# Patient Record
Sex: Male | Born: 1975 | Race: White | Hispanic: No | Marital: Single | Smoking: Never smoker
Health system: Southern US, Community
[De-identification: ages and names within clinical notes are randomized; demographics above are authoritative.]

## PROBLEM LIST (undated history)

## (undated) DIAGNOSIS — E119 Type 2 diabetes mellitus without complications: Secondary | ICD-10-CM

## (undated) HISTORY — PX: APPENDECTOMY: SHX54

## (undated) HISTORY — PX: CHOLECYSTECTOMY: SHX55

---

## 2020-01-08 ENCOUNTER — Emergency Department
Admission: EM | Admit: 2020-01-08 | Discharge: 2020-01-08 | Disposition: A | Discharge status: Home / Self Care | Payer: Self-pay | Attending: Emergency Medicine | Admitting: Emergency Medicine

## 2020-01-08 ENCOUNTER — Emergency Department: Payer: Self-pay

## 2020-01-08 ENCOUNTER — Other Ambulatory Visit: Payer: Self-pay

## 2020-01-08 ENCOUNTER — Encounter: Payer: Self-pay | Admitting: Emergency Medicine

## 2020-01-08 DIAGNOSIS — S22080A Wedge compression fracture of T11-T12 vertebra, initial encounter for closed fracture: Secondary | ICD-10-CM | POA: Insufficient documentation

## 2020-01-08 DIAGNOSIS — Y999 Unspecified external cause status: Secondary | ICD-10-CM | POA: Insufficient documentation

## 2020-01-08 DIAGNOSIS — S32010A Wedge compression fracture of first lumbar vertebra, initial encounter for closed fracture: Secondary | ICD-10-CM | POA: Insufficient documentation

## 2020-01-08 DIAGNOSIS — Y9389 Activity, other specified: Secondary | ICD-10-CM | POA: Insufficient documentation

## 2020-01-08 DIAGNOSIS — W08XXXA Fall from other furniture, initial encounter: Secondary | ICD-10-CM | POA: Insufficient documentation

## 2020-01-08 DIAGNOSIS — Y9289 Other specified places as the place of occurrence of the external cause: Secondary | ICD-10-CM | POA: Insufficient documentation

## 2020-01-08 IMAGING — MR MR LUMBAR SPINE W/O CM
5 series · 33 of 48 positions shown · non-contrast
Comparison: None.

CLINICAL DATA: Mid to low back pain.

EXAM:
MRI THORACIC AND LUMBAR SPINE WITHOUT CONTRAST
TECHNIQUE: Multiplanar and multiecho pulse sequences of the thoracic and lumbar
spine were obtained without intravenous contrast.

[Series 19: T2 · sagittal · 4.0mm · 1.09mm/px · 6 of 17 slices shown (1 of 2)]
[im 1/17]
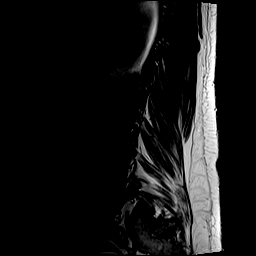
[im 4/17]
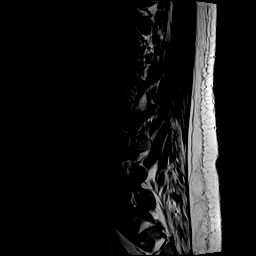
[im 7/17]
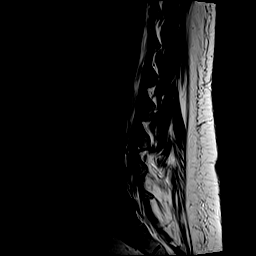
[im 10/17]
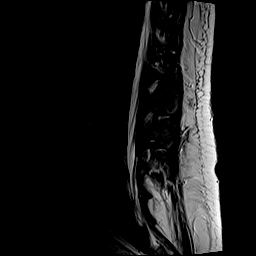
[im 13/17]
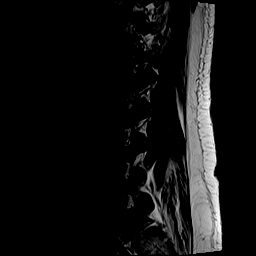
[im 17/17]
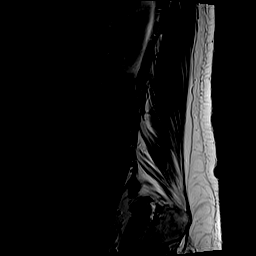

[Series 20: T1 · sagittal · 4.0mm · 1.09mm/px · 7 of 17 slices shown (1 of 2)]
[im 1/17]
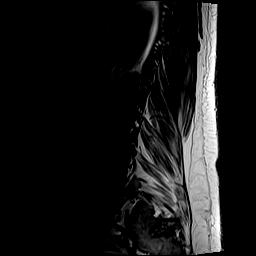
[im 3/17]
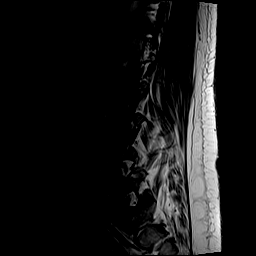
[im 6/17]
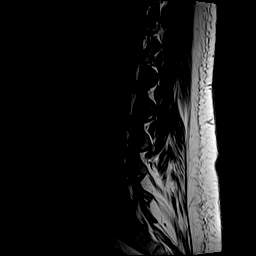
[im 9/17]
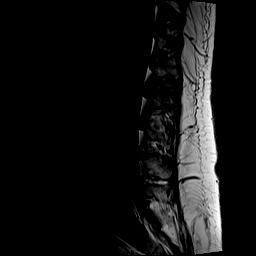
[im 11/17]
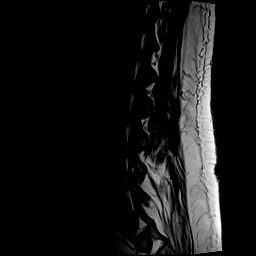
[im 14/17]
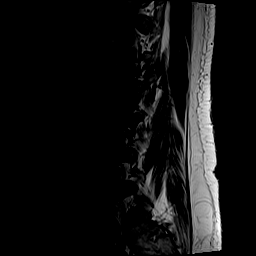
[im 17/17]
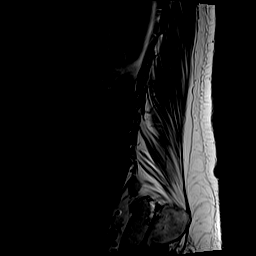

[Series 21: STIR · sagittal · 4.0mm · 0.55mm/px · 4 of 17 slices shown]
[im 1/17]
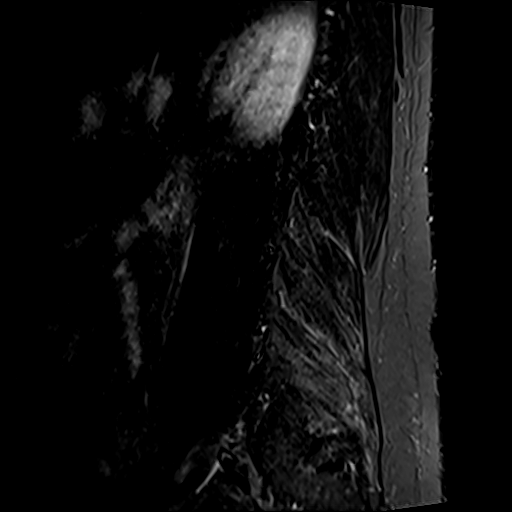
[im 3/17]
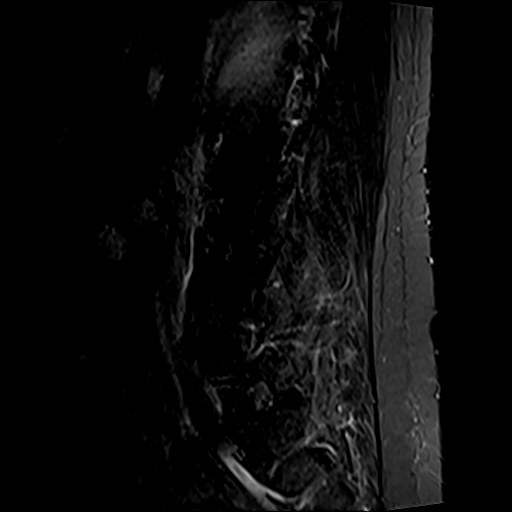
[im 6/17]
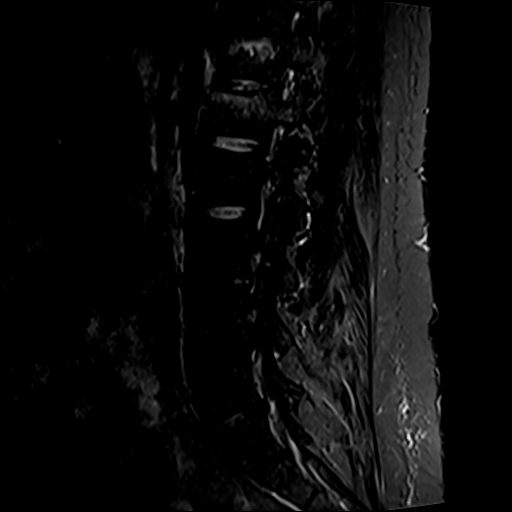
[im 9/17]
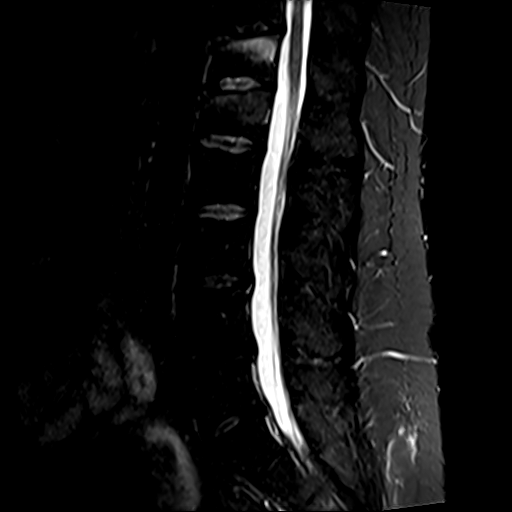

[Series 22: T2 · axial · 4.0mm · 0.78mm/px · z∈[-618,-393]mm · 8 of 36 slices shown (2 of 2)]
[im 1/36]
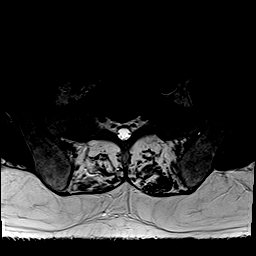
[im 6/36]
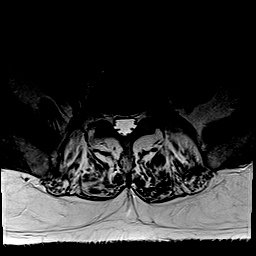
[im 11/36]
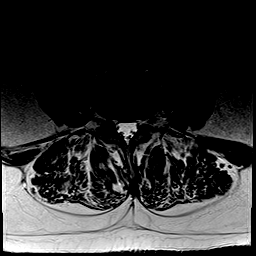
[im 17/36]
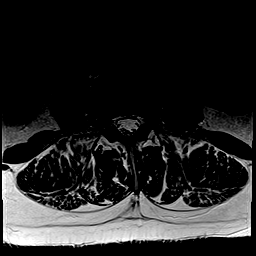
[im 19/36]
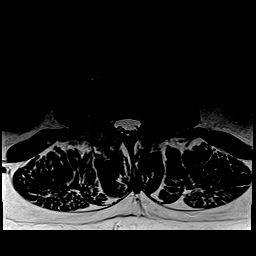
[im 25/36]
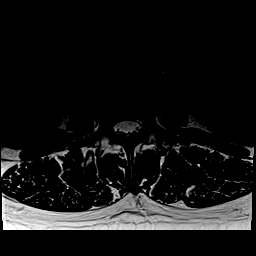
[im 30/36]
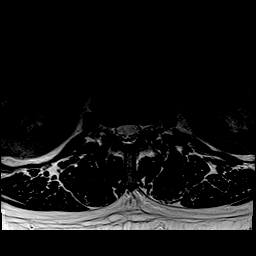
[im 36/36]
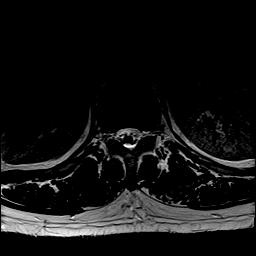

[Series 23: T1 · axial · 4.0mm · 0.39mm/px · z∈[-618,-393]mm · 8 of 36 slices shown (2 of 2)]
[im 1/36]
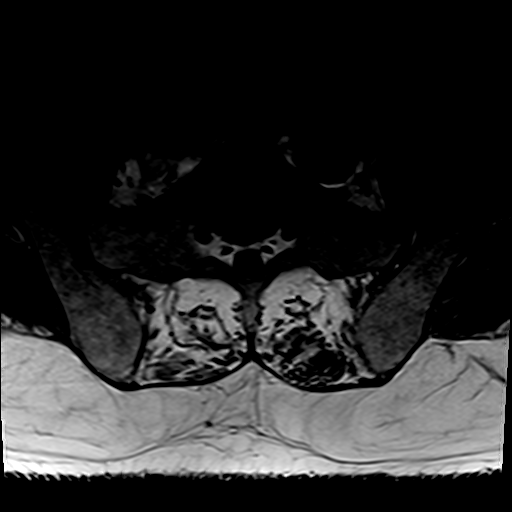
[im 6/36]
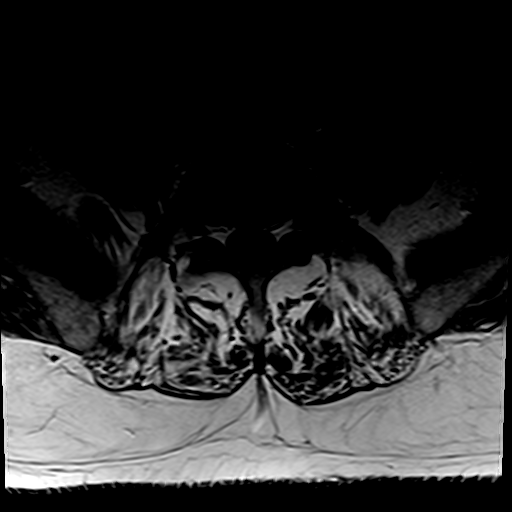
[im 11/36]
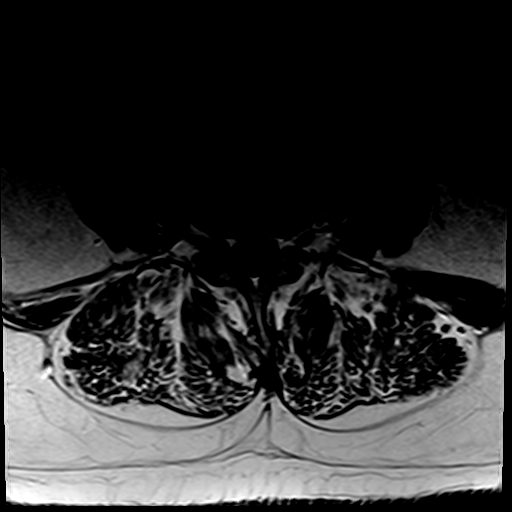
[im 17/36]
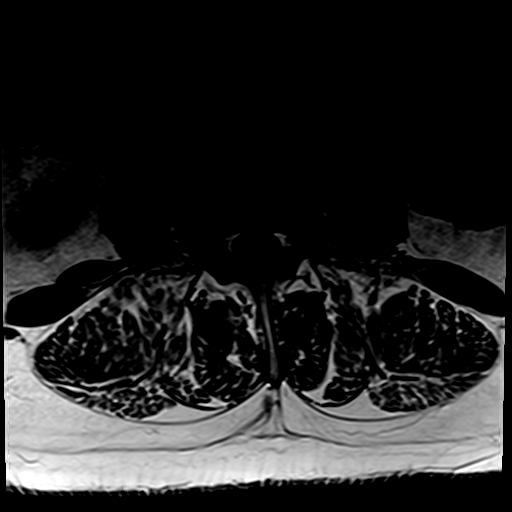
[im 19/36]
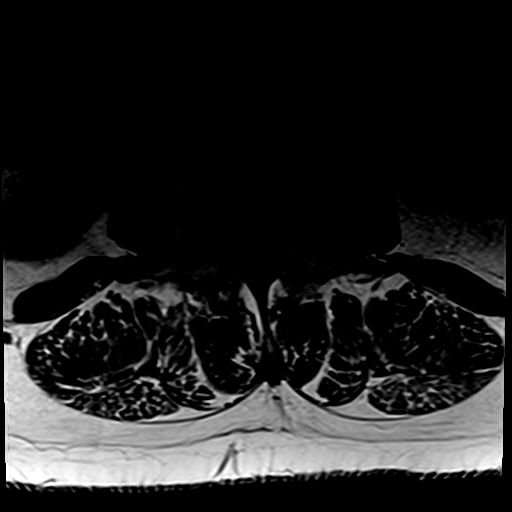
[im 25/36]
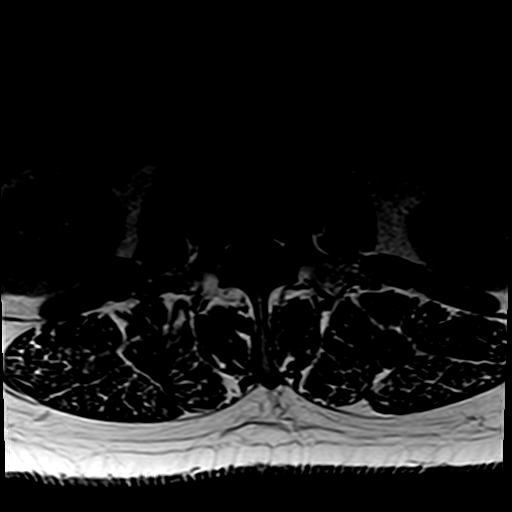
[im 30/36]
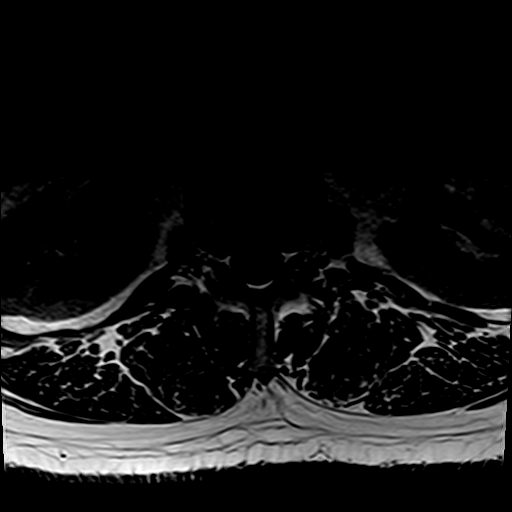
[im 36/36]
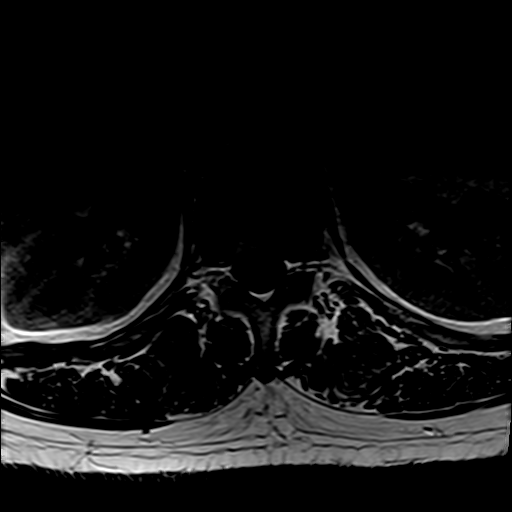

[33 of 48 positions shown; findings below may reference images not displayed]

FINDINGS: MRI THORACIC SPINE FINDINGS

Alignment:  Standard

Vertebrae: There is edema below the superior endplate of T12 with
less than 25% height loss.

Cord:  Normal signal and morphology.

Paraspinal and other soft tissues: Negative.

Disc levels:

Mild multilevel degenerative disc disease does not result in spinal
canal or neural foraminal stenosis. There is no retropulsion or
canal narrowing associated with the T12 injury.

MRI LUMBAR SPINE FINDINGS

Segmentation:  Standard

Alignment:  Normal

Vertebrae: Mild edema underlying the L1 superior endplate. No height
loss or retropulsion.

Conus medullaris and cauda equina: Conus extends to the L1 level.
Conus and cauda equina appear normal.

Paraspinal and other soft tissues: Negative

Disc levels:

T12-L1: Normal disc space and facet joints. There is no spinal canal
stenosis. No neural foraminal stenosis.

L1-L2: Normal disc space and facet joints. There is no spinal canal
stenosis. No neural foraminal stenosis.

L2-L3: Normal disc space and facet joints. There is no spinal canal
stenosis. No neural foraminal stenosis.

L3-L4: Small central disc protrusion there is no spinal canal
stenosis. No neural foraminal stenosis.

L4-L5: Disc desiccation with intermediate sized central disc
extrusion with inferior migration to the suprapedicle level. There
is endplate spurring. Right lateral recess narrowing without central
spinal canal stenosis. No neural foraminal stenosis.

L5-S1: Small central disc protrusion. Right lateral recess narrowing
without central spinal canal stenosis. No neural foraminal stenosis.

Visualized sacrum: Normal.  The coccyx is outside the field of view.
IMPRESSION: 1. Acute compression fractures of T12 and L1 with less than 25%
height loss. No retropulsion or associated stenosis.
2. Multilevel mild thoracic degenerative disc disease without spinal
canal or neural foraminal stenosis.
3. L4-L5 central disc extrusion with inferior migration narrowing
the right lateral recess, which may serve as a source of L5
radiculopathy.
4. L5-S1 central disc protrusion narrowing the right lateral recess,
which may serve as a source of S1 radiculopathy.

## 2020-01-08 IMAGING — CR DG LUMBAR SPINE COMPLETE 4+V
1 series · 5 of 5 positions shown · non-contrast
Comparison: None.

CLINICAL DATA: Injury. Mid to low back pain.

EXAM:
LUMBAR SPINE - COMPLETE 4+ VIEW

[Series 1: dg lumbar spine complete 4 +v · 0.14mm/px · 5 of 5 slices shown]
[im 1/5]
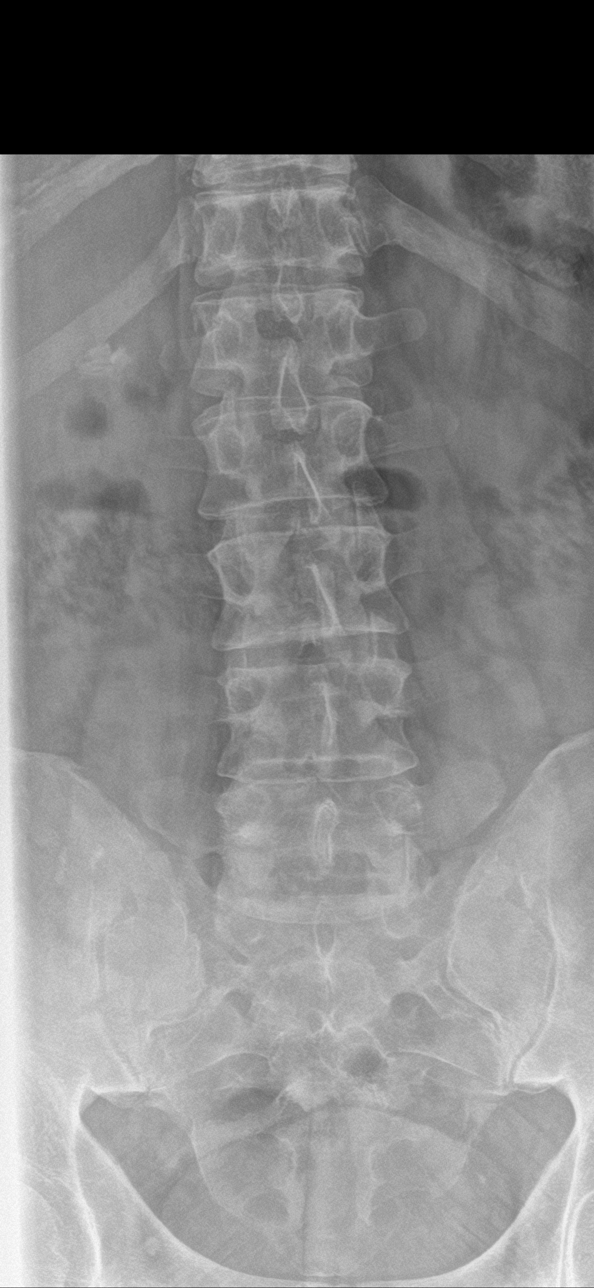
[im 2/5]
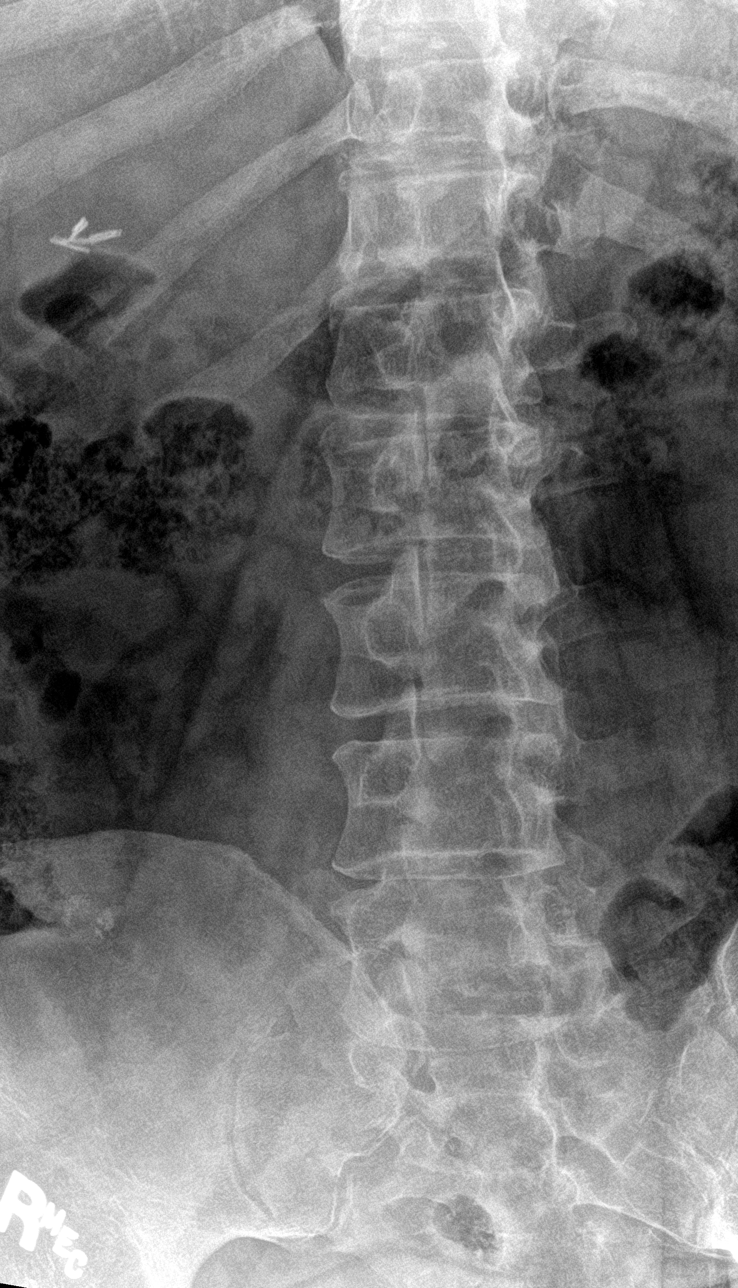
[im 3/5]
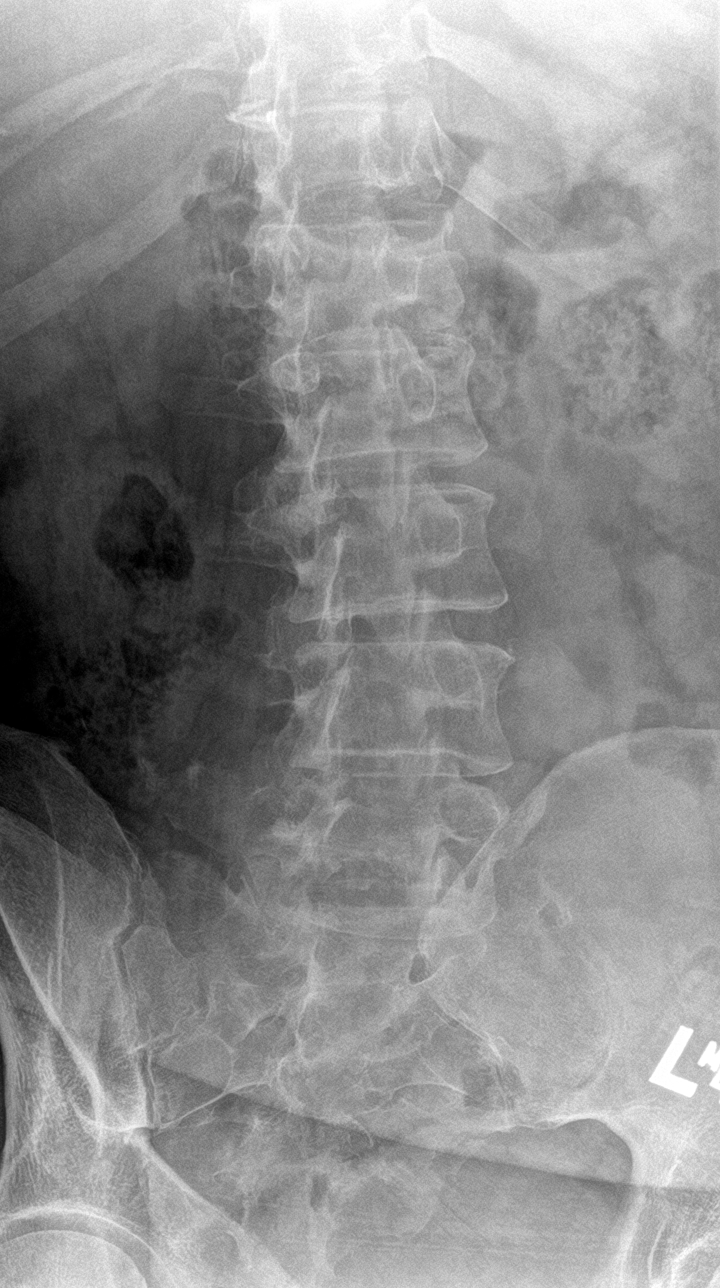
[im 4/5]
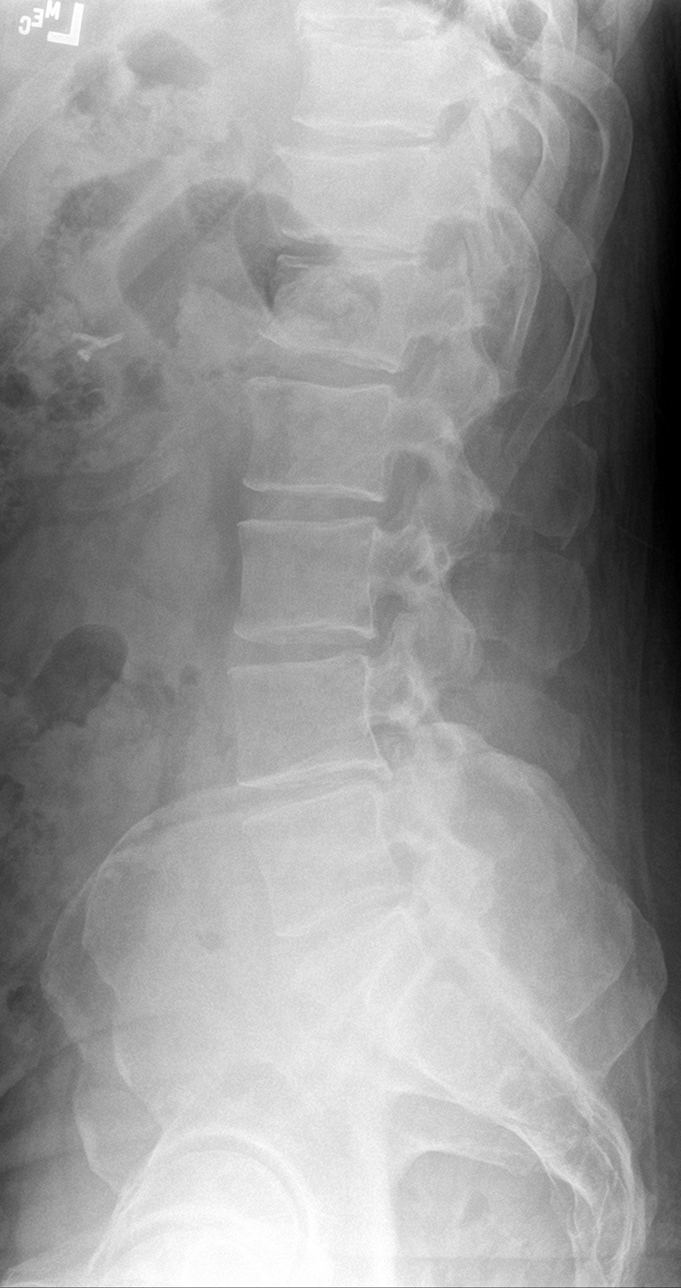
[im 5/5]
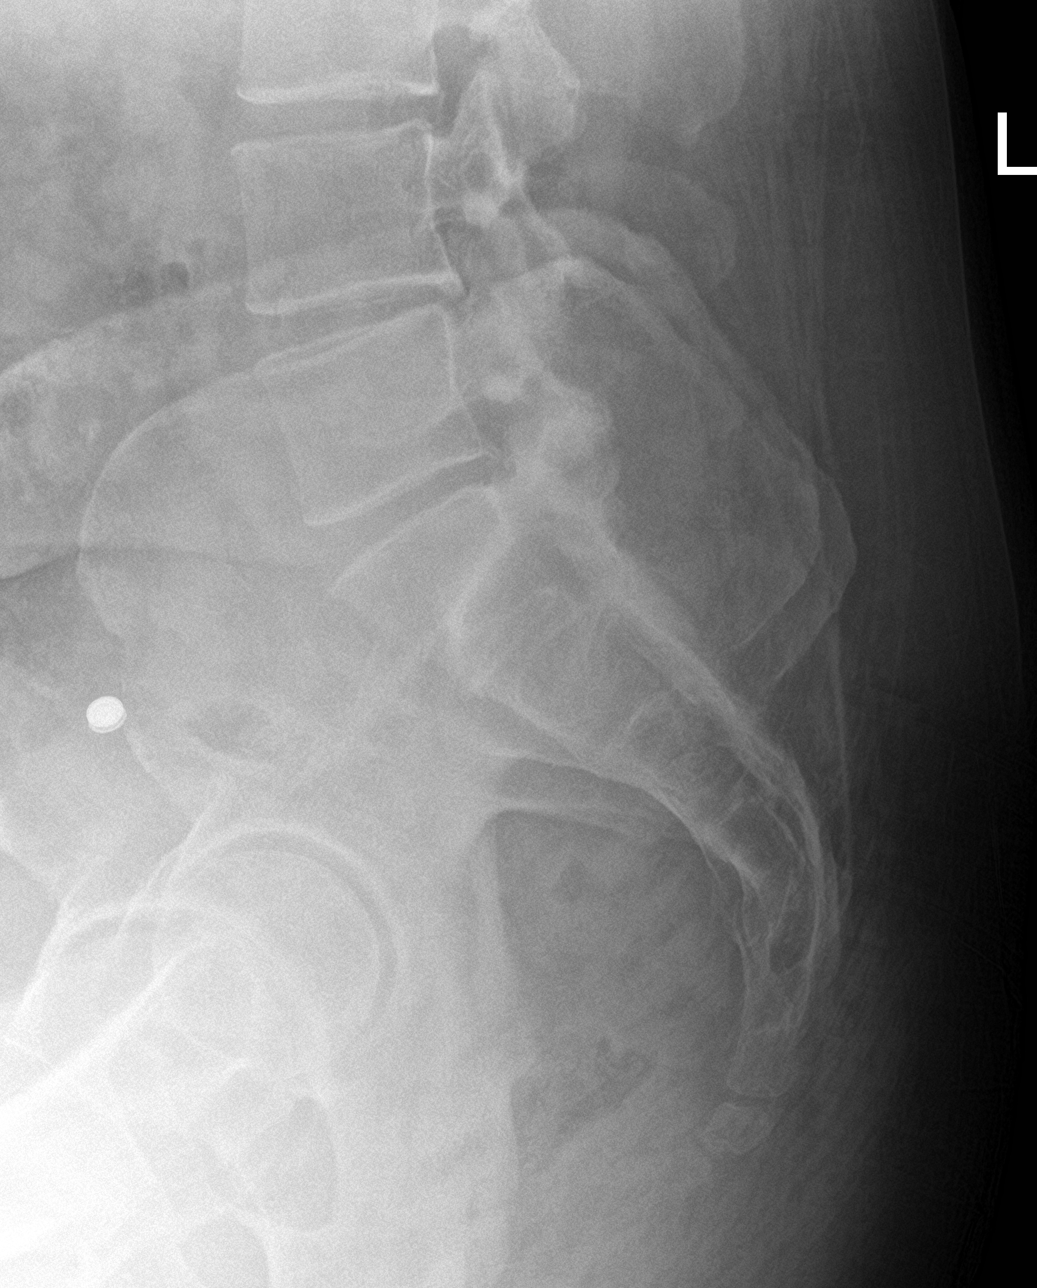

[5 of 5 positions shown; findings below may reference images not displayed]

FINDINGS: Mild scoliosis of the upper lumbar spine. No evidence of acute
vertebral body subluxation.

Mild compression deformities of the T12 and L1 vertebral bodies, of
uncertain age but most likely chronic based on appearance. No
retropulsion. No fracture line or displaced fracture fragment seen.
L2 through L5 vertebral bodies appear intact and normal in height.
Upper sacrum appears intact and normally aligned.

Mild disc space narrowings at the L4-5 and L5-S1 levels. No large
osteophytes or other signs of advanced degenerative disc disease. No
evidence of pars interarticularis defect.

Cholecystectomy clips in the RIGHT upper quadrant. Visualized
paravertebral soft tissues are otherwise unremarkable.
IMPRESSION: 1. Mild compression deformities of the T12 and L1 vertebral bodies,
of uncertain age but most likely chronic based on overall
appearance. No fracture line or displaced fracture fragment seen. If
focal midline pain at the thoracolumbar junction, could consider MRI
to evaluate age.
2. Mild degenerative change in the lower lumbar spine.
3. Mild scoliosis of the upper lumbar spine.
4. No acute appearing abnormality.

## 2020-01-08 IMAGING — CR DG THORACIC SPINE 2V
1 series · 3 of 3 positions shown · non-contrast
Comparison: None.

CLINICAL DATA: Injury, back pain.

EXAM:
THORACIC SPINE 2 VIEWS

[Series 1: dg thoracic spine 2 view · 0.14mm/px · 3 of 3 slices shown]
[im 1/3]
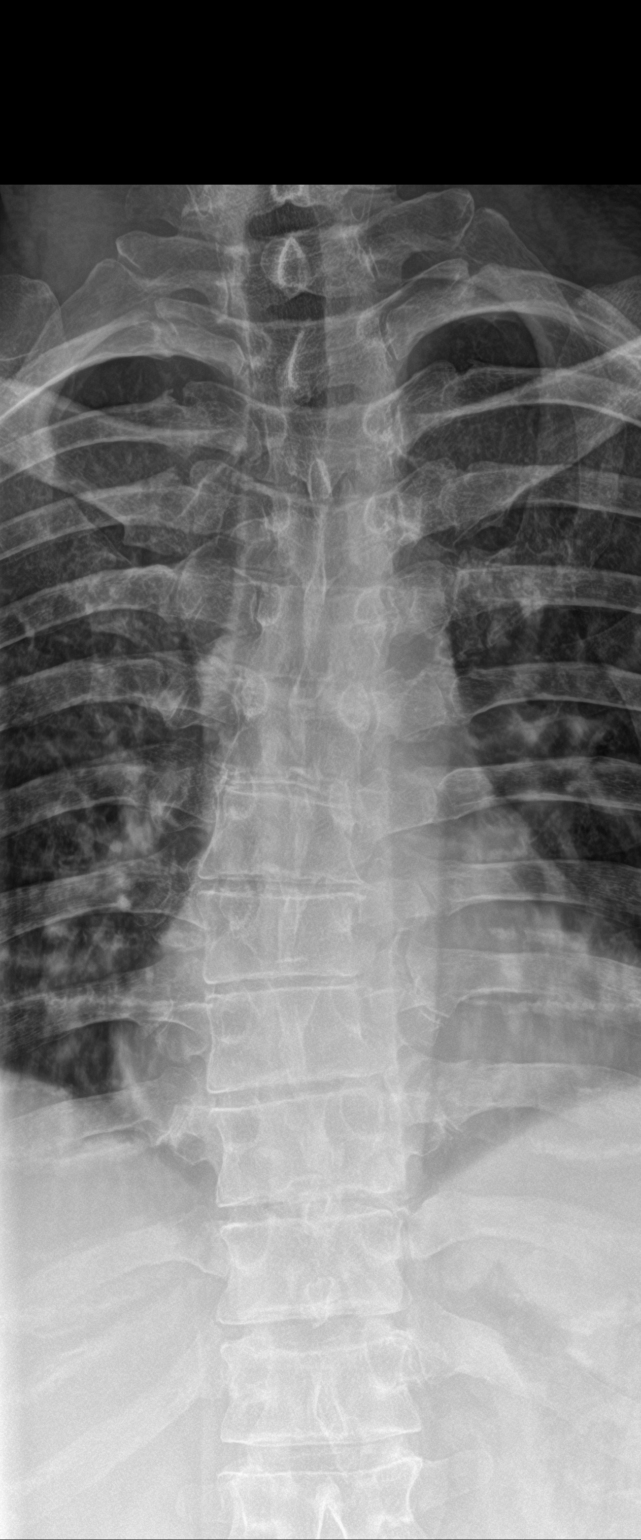
[im 2/3]
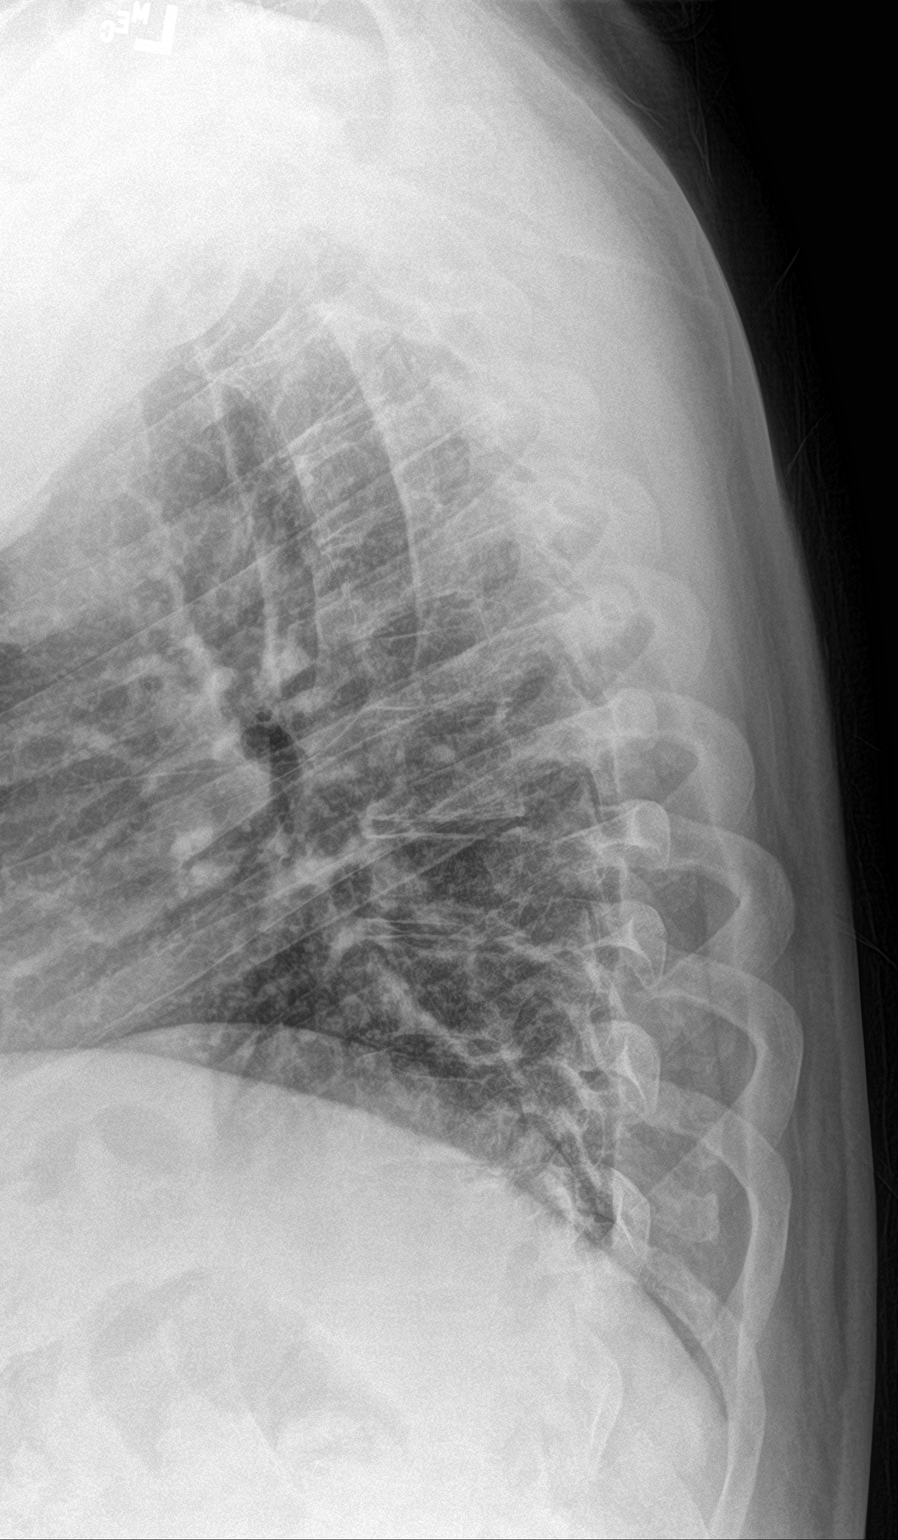
[im 3/3]
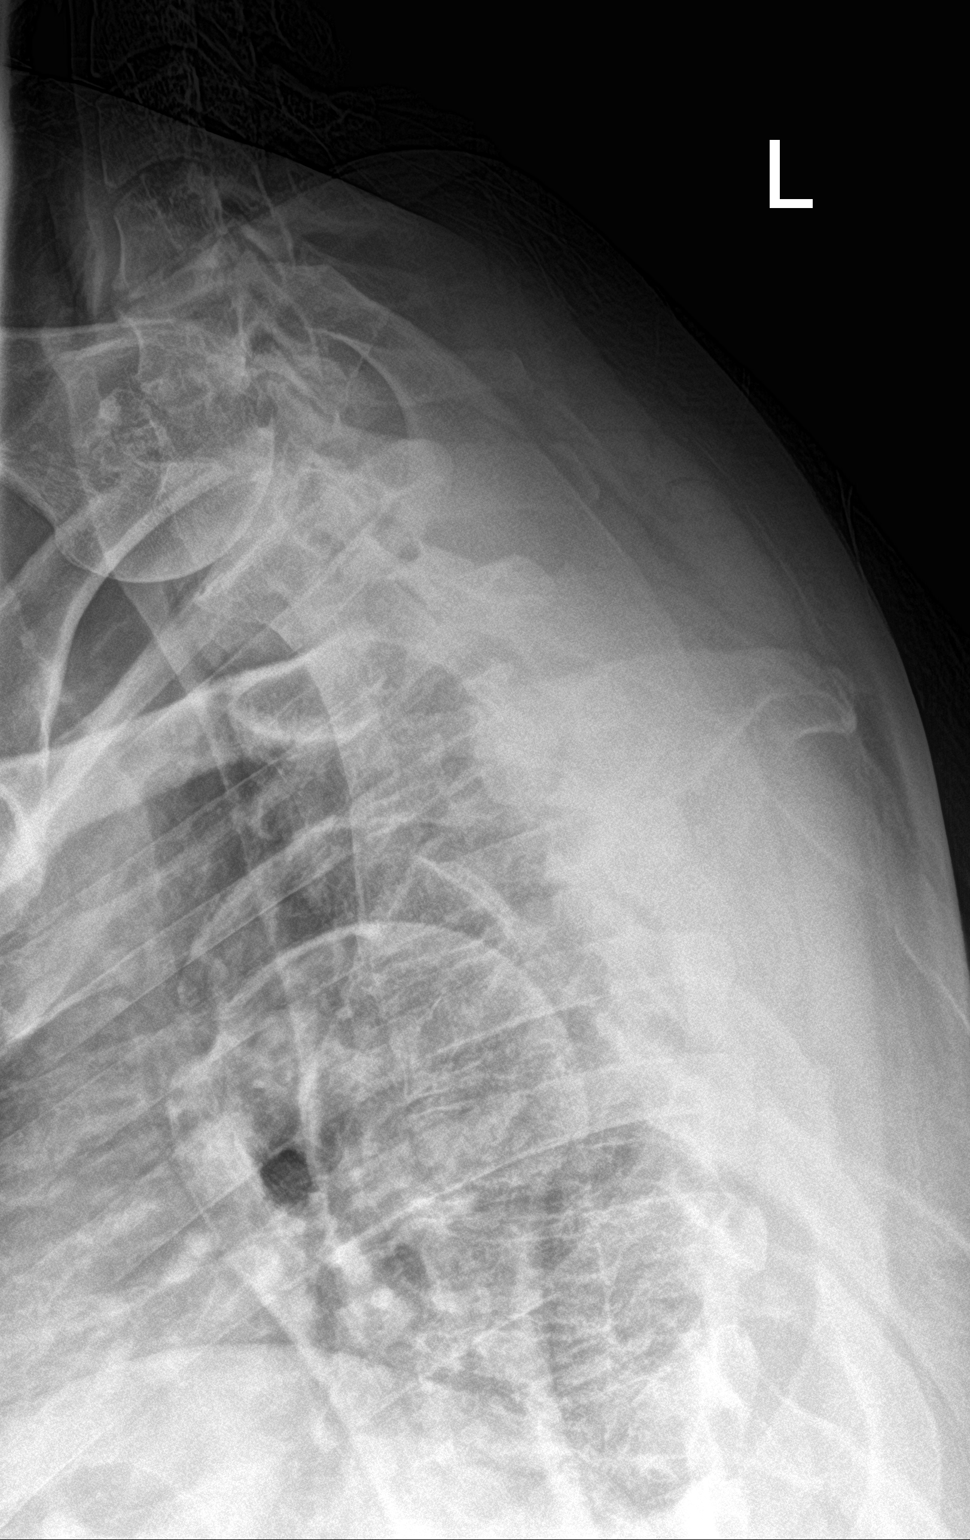

[3 of 3 positions shown; findings below may reference images not displayed]

FINDINGS: S-shaped scoliosis of the thoracic spine, mild to moderate in
degree. No fracture line or displaced fracture fragment appreciated.
Visualized paravertebral soft tissues are unremarkable.
IMPRESSION: 1. No acute findings. No osseous fracture or dislocation identified.
2. S-shaped scoliosis of the thoracic spine, mild to moderate in
degree.

## 2020-01-08 IMAGING — MR MR THORACIC SPINE W/O CM
5 series · 32 of 48 positions shown · non-contrast
Comparison: None.

CLINICAL DATA: Mid to low back pain.

EXAM:
MRI THORACIC AND LUMBAR SPINE WITHOUT CONTRAST
TECHNIQUE: Multiplanar and multiecho pulse sequences of the thoracic and lumbar
spine were obtained without intravenous contrast.

[Series 16: T2 · sagittal · 3.0mm · 1.33mm/px · 6 of 17 slices shown (1 of 2)]
[im 1/17]
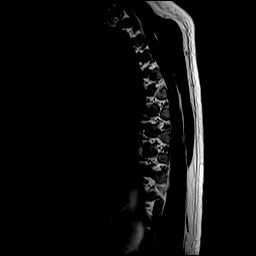
[im 4/17]
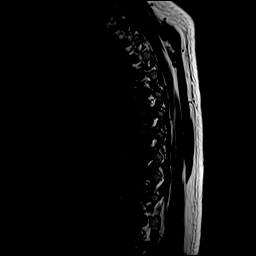
[im 7/17]
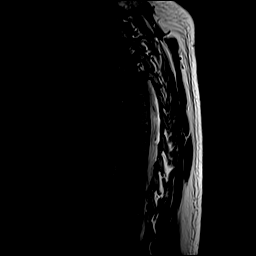
[im 10/17]
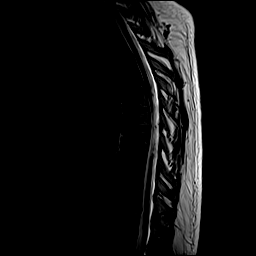
[im 13/17]
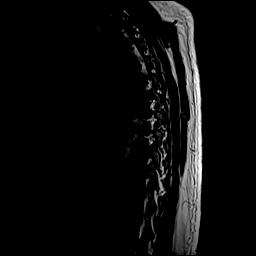
[im 17/17]
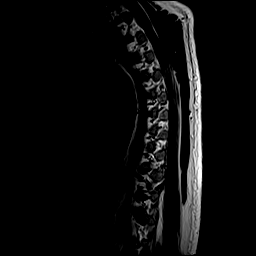

[Series 17: T1 · sagittal · 3.0mm · 1.33mm/px · 6 of 17 slices shown]
[im 1/17]
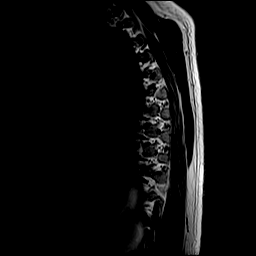
[im 4/17]
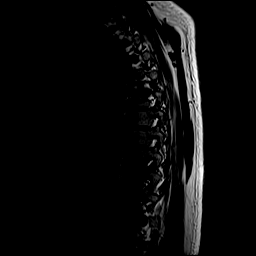
[im 7/17]
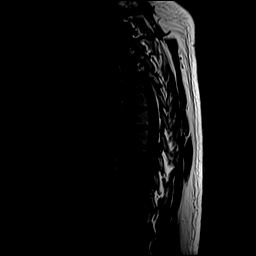
[im 10/17]
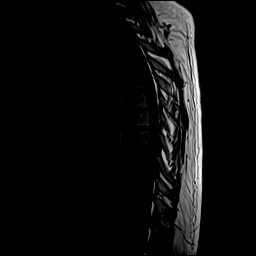
[im 13/17]
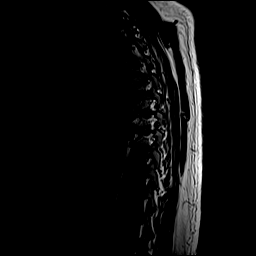
[im 17/17]
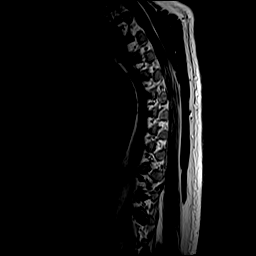

[Series 18: STIR · sagittal · 3.0mm · 0.66mm/px · 6 of 17 slices shown]
[im 1/17]
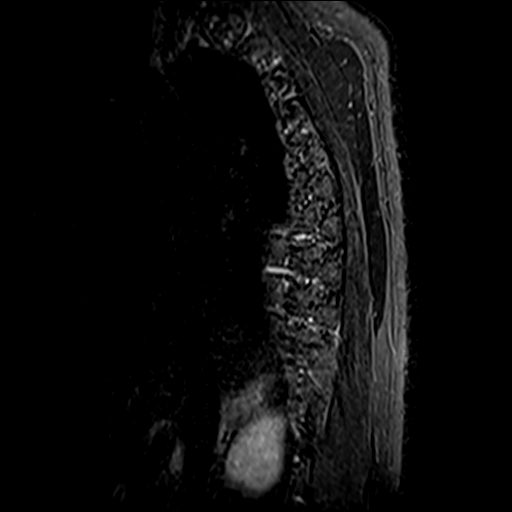
[im 4/17]
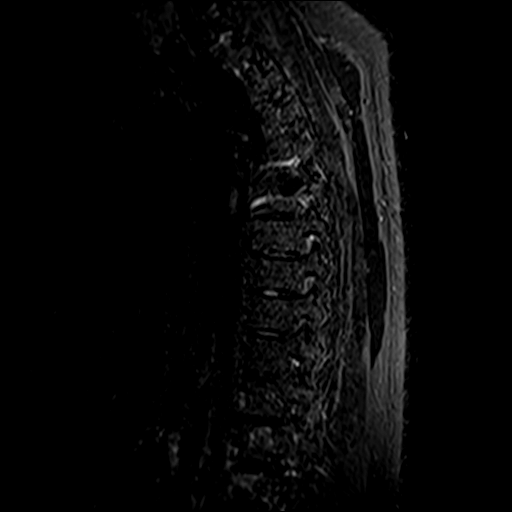
[im 7/17]
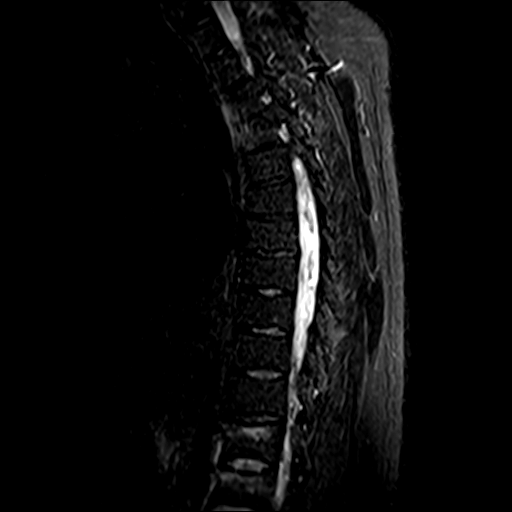
[im 10/17]
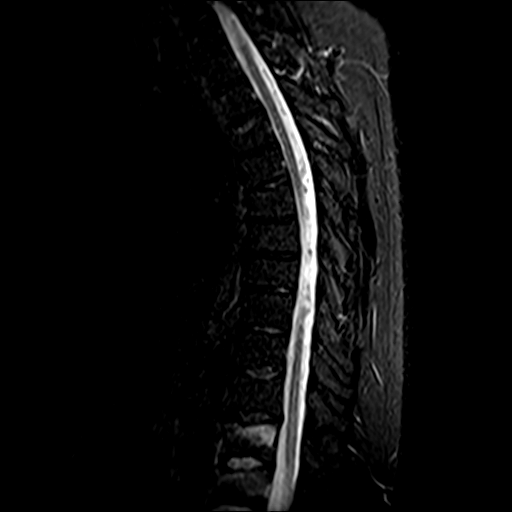
[im 13/17]
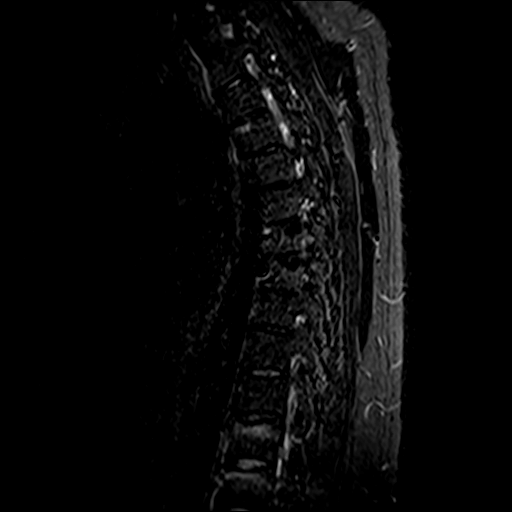
[im 17/17]
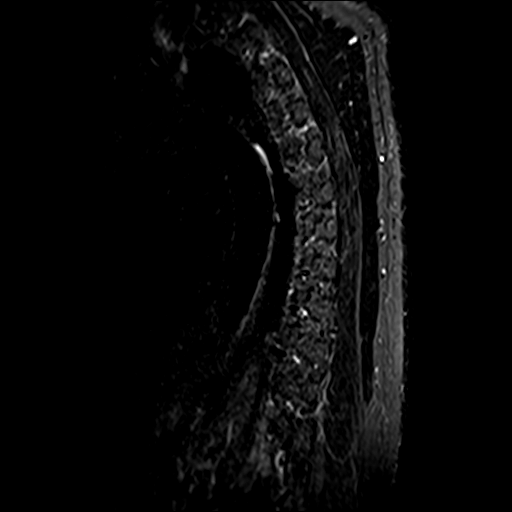

[Series 19: T2 · axial · 4.0mm · 0.59mm/px · z∈[-402,-153]mm · 9 of 39 slices shown (2 of 2)]
[im 1/39]
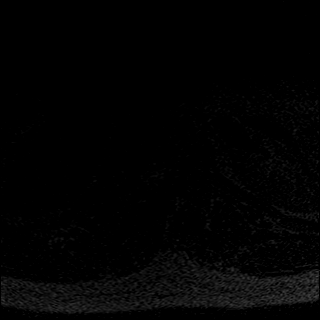
[im 6/39]
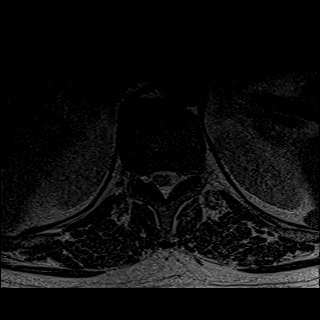
[im 11/39]
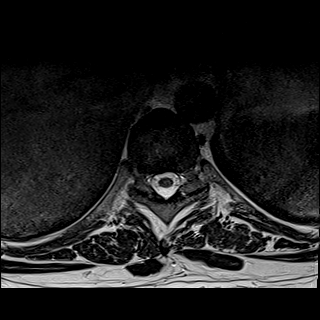
[im 17/39]
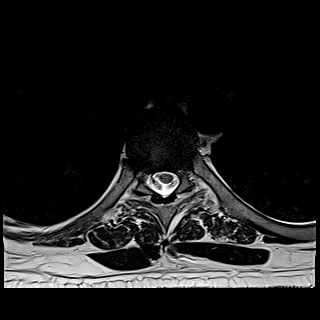
[im 20/39]
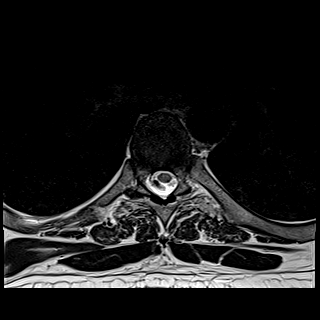
[im 22/39]
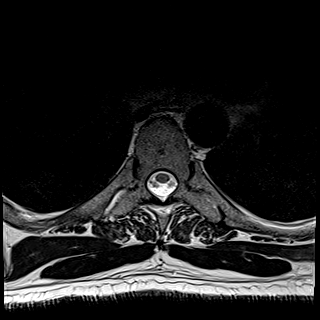
[im 28/39]
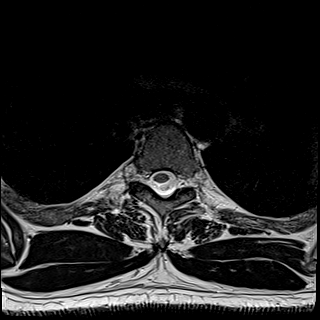
[im 33/39]
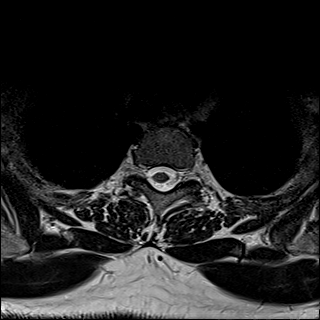
[im 39/39]
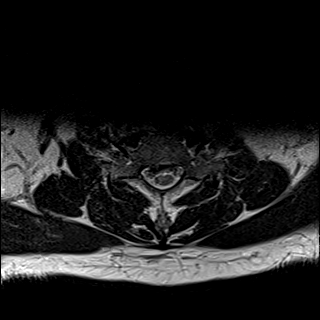

[Series 20: GRE · axial · 4.0mm · 0.37mm/px · z∈[-402,-224]mm · 5 of 39 slices shown]
[im 1/39]
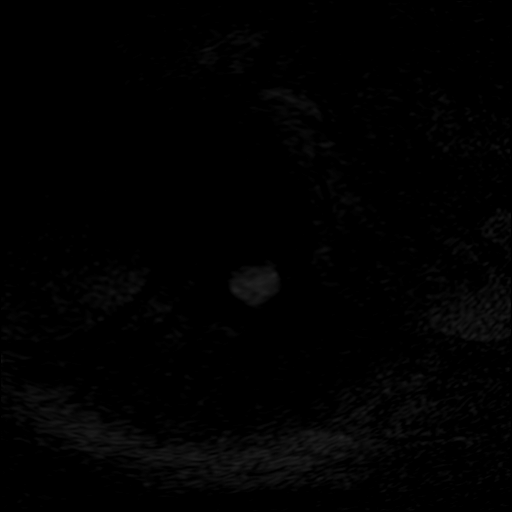
[im 6/39]
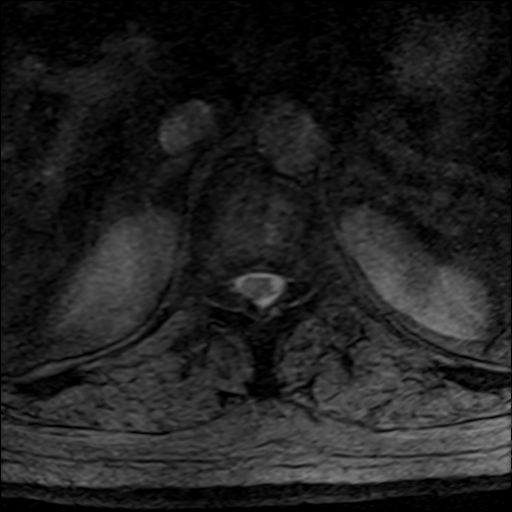
[im 11/39]
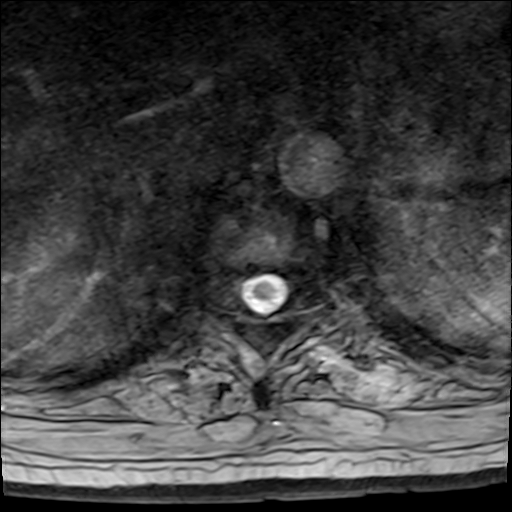
[im 17/39]
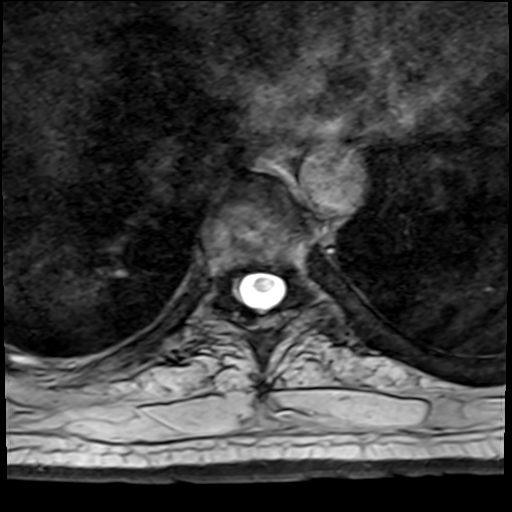
[im 22/39]
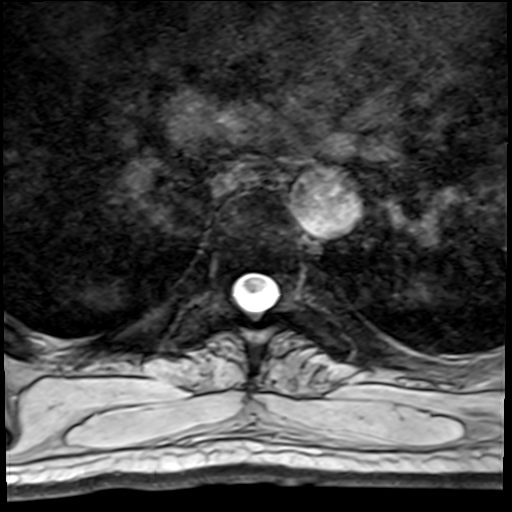

[32 of 48 positions shown; findings below may reference images not displayed]

FINDINGS: MRI THORACIC SPINE FINDINGS

Alignment:  Standard

Vertebrae: There is edema below the superior endplate of T12 with
less than 25% height loss.

Cord:  Normal signal and morphology.

Paraspinal and other soft tissues: Negative.

Disc levels:

Mild multilevel degenerative disc disease does not result in spinal
canal or neural foraminal stenosis. There is no retropulsion or
canal narrowing associated with the T12 injury.

MRI LUMBAR SPINE FINDINGS

Segmentation:  Standard

Alignment:  Normal

Vertebrae: Mild edema underlying the L1 superior endplate. No height
loss or retropulsion.

Conus medullaris and cauda equina: Conus extends to the L1 level.
Conus and cauda equina appear normal.

Paraspinal and other soft tissues: Negative

Disc levels:

T12-L1: Normal disc space and facet joints. There is no spinal canal
stenosis. No neural foraminal stenosis.

L1-L2: Normal disc space and facet joints. There is no spinal canal
stenosis. No neural foraminal stenosis.

L2-L3: Normal disc space and facet joints. There is no spinal canal
stenosis. No neural foraminal stenosis.

L3-L4: Small central disc protrusion there is no spinal canal
stenosis. No neural foraminal stenosis.

L4-L5: Disc desiccation with intermediate sized central disc
extrusion with inferior migration to the suprapedicle level. There
is endplate spurring. Right lateral recess narrowing without central
spinal canal stenosis. No neural foraminal stenosis.

L5-S1: Small central disc protrusion. Right lateral recess narrowing
without central spinal canal stenosis. No neural foraminal stenosis.

Visualized sacrum: Normal.  The coccyx is outside the field of view.
IMPRESSION: 1. Acute compression fractures of T12 and L1 with less than 25%
height loss. No retropulsion or associated stenosis.
2. Multilevel mild thoracic degenerative disc disease without spinal
canal or neural foraminal stenosis.
3. L4-L5 central disc extrusion with inferior migration narrowing
the right lateral recess, which may serve as a source of L5
radiculopathy.
4. L5-S1 central disc protrusion narrowing the right lateral recess,
which may serve as a source of S1 radiculopathy.

## 2020-01-08 IMAGING — MR MR THORACIC SPINE W/O CM
1 series · 9 of 9 positions shown · non-contrast
Comparison: None.

CLINICAL DATA: Mid to low back pain.

EXAM:
MRI THORACIC AND LUMBAR SPINE WITHOUT CONTRAST
TECHNIQUE: Multiplanar and multiecho pulse sequences of the thoracic and lumbar
spine were obtained without intravenous contrast.

[Series 18: T1 · sagittal · 6.0mm · 1.48mm/px · 9 of 9 slices shown]
[im 1/9]
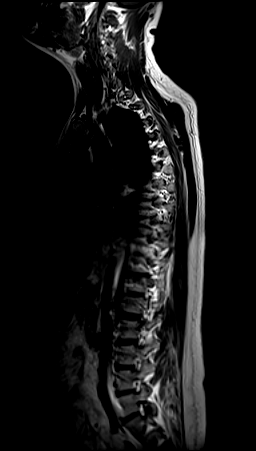
[im 2/9]
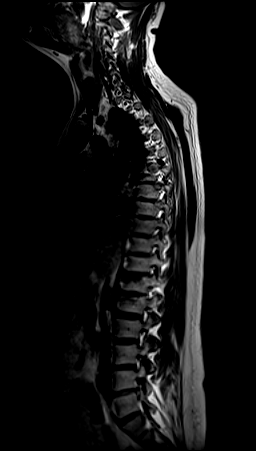
[im 3/9]
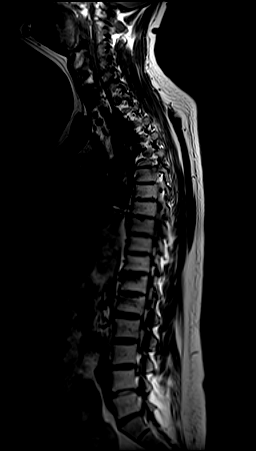
[im 4/9]
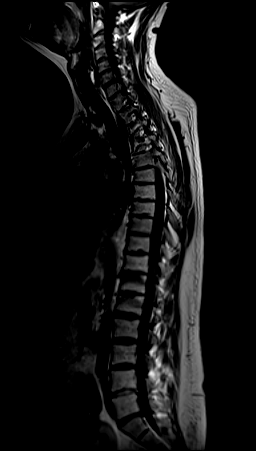
[im 5/9]
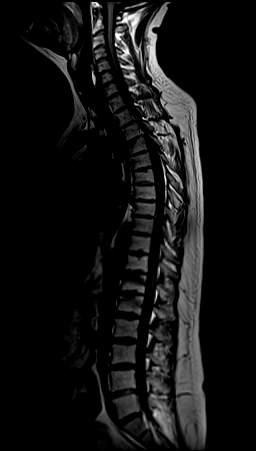
[im 6/9]
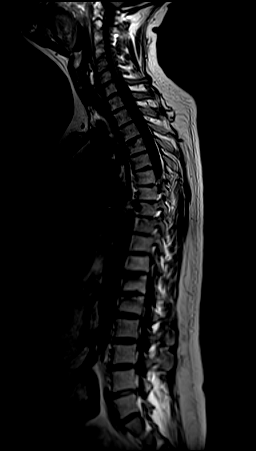
[im 7/9]
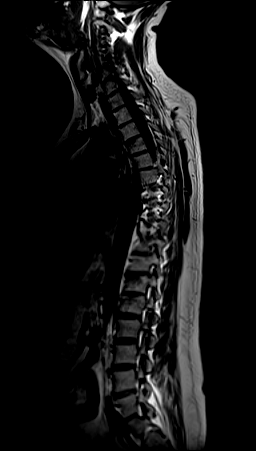
[im 8/9]
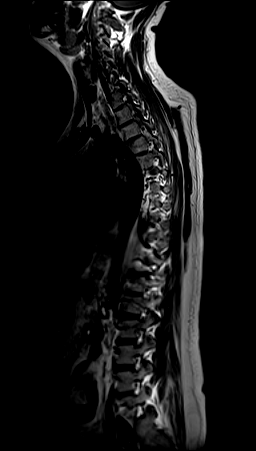
[im 9/9]
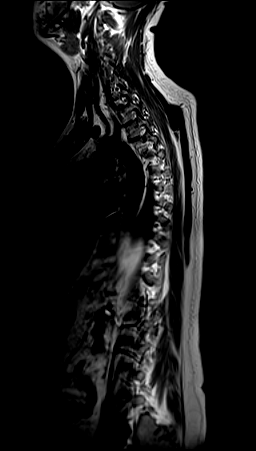

[9 of 9 positions shown; findings below may reference images not displayed]

FINDINGS: MRI THORACIC SPINE FINDINGS

Alignment:  Standard

Vertebrae: There is edema below the superior endplate of T12 with
less than 25% height loss.

Cord:  Normal signal and morphology.

Paraspinal and other soft tissues: Negative.

Disc levels:

Mild multilevel degenerative disc disease does not result in spinal
canal or neural foraminal stenosis. There is no retropulsion or
canal narrowing associated with the T12 injury.

MRI LUMBAR SPINE FINDINGS

Segmentation:  Standard

Alignment:  Normal

Vertebrae: Mild edema underlying the L1 superior endplate. No height
loss or retropulsion.

Conus medullaris and cauda equina: Conus extends to the L1 level.
Conus and cauda equina appear normal.

Paraspinal and other soft tissues: Negative

Disc levels:

T12-L1: Normal disc space and facet joints. There is no spinal canal
stenosis. No neural foraminal stenosis.

L1-L2: Normal disc space and facet joints. There is no spinal canal
stenosis. No neural foraminal stenosis.

L2-L3: Normal disc space and facet joints. There is no spinal canal
stenosis. No neural foraminal stenosis.

L3-L4: Small central disc protrusion there is no spinal canal
stenosis. No neural foraminal stenosis.

L4-L5: Disc desiccation with intermediate sized central disc
extrusion with inferior migration to the suprapedicle level. There
is endplate spurring. Right lateral recess narrowing without central
spinal canal stenosis. No neural foraminal stenosis.

L5-S1: Small central disc protrusion. Right lateral recess narrowing
without central spinal canal stenosis. No neural foraminal stenosis.

Visualized sacrum: Normal.  The coccyx is outside the field of view.
IMPRESSION: 1. Acute compression fractures of T12 and L1 with less than 25%
height loss. No retropulsion or associated stenosis.
2. Multilevel mild thoracic degenerative disc disease without spinal
canal or neural foraminal stenosis.
3. L4-L5 central disc extrusion with inferior migration narrowing
the right lateral recess, which may serve as a source of L5
radiculopathy.
4. L5-S1 central disc protrusion narrowing the right lateral recess,
which may serve as a source of S1 radiculopathy.

## 2020-01-08 MED ORDER — OXYCODONE-ACETAMINOPHEN 5-325 MG PO TABS
1.0000 | ORAL_TABLET | Freq: Once | ORAL | Status: AC
  Administered 2020-01-08: 1 via ORAL
  Filled 2020-01-08: qty 1

## 2020-01-08 MED ORDER — ONDANSETRON 4 MG PO TBDP
4.0000 mg | ORAL_TABLET | Freq: Once | ORAL | Status: AC
  Administered 2020-01-08: 4 mg via ORAL
  Filled 2020-01-08: qty 1

## 2020-01-08 MED ORDER — MORPHINE SULFATE (PF) 4 MG/ML IV SOLN
4.0000 mg | Freq: Once | INTRAVENOUS | Status: AC
  Administered 2020-01-08: 4 mg via INTRAVENOUS
  Filled 2020-01-08: qty 1

## 2020-01-08 MED ORDER — MELOXICAM 15 MG PO TABS: 15.0000 mg | ORAL_TABLET | Freq: Every day | ORAL | 1 refills | Status: AC

## 2020-01-08 MED ORDER — OXYCODONE-ACETAMINOPHEN 5-325 MG PO TABS: 1.0000 | ORAL_TABLET | Freq: Four times a day (QID) | ORAL | 0 refills | Status: AC | PRN

## 2020-01-08 MED ORDER — ONDANSETRON HCL 4 MG/2ML IJ SOLN
4.0000 mg | Freq: Once | INTRAMUSCULAR | Status: AC
  Administered 2020-01-08: 4 mg via INTRAVENOUS
  Filled 2020-01-08: qty 2

## 2020-01-08 MED ORDER — FENTANYL CITRATE (PF) 100 MCG/2ML IJ SOLN
50.0000 ug | Freq: Once | INTRAMUSCULAR | Status: AC
  Administered 2020-01-08: 17:00:00 50 ug via INTRAVENOUS
  Filled 2020-01-08: qty 2

## 2020-01-08 NOTE — ED Notes (Signed)
See triage note, pt reports he was sitting on a picnic table today where two horses were tied, horses got startled and threw him off the picnic table. C/o lower back pain and tailbone pain.  Denies problems controlling bowels or bladder. Denies loss of feeling in legs or feet. Reports difficulty walking.  Pt appears in pain while laying still in stretcher.  Denies hitting head

## 2020-01-08 NOTE — ED Triage Notes (Signed)
Pt to ED via POV c/o mid-to-lower back pain. Pt states that he is also having pain in his tailbone. Pt reports that about 45 minutes PTA he was sitting on a picnic table that had 2 horses tied to it. Pt reports that he horses were spooked and started running, throwing him into the air and he landed on his butt. Pt denies loss of bowel or bladder control. Pt denies numbness or tingling in his legs. Pt appears to be uncomfortable.

## 2020-01-08 NOTE — ED Notes (Signed)
TLSO brace placed by brace technician.
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
## 2020-01-08 NOTE — Progress Notes (Signed)
Orthopedic Tech Progress Note Patient Details:  Clifford Gilbert [birth year] [id number]  Patient ID: [name], male   DOB: [birth year], 44 y.o.   MRN: [id number] Called in stat order to [facility].  Trinna Post 01/08/2020, 8:28 PM

## 2020-01-08 NOTE — ED Notes (Signed)
Pt requesting something PO and more pain medication. PA notified, orders for NPO and medication received.

## 2020-01-08 NOTE — ED Notes (Signed)
Pt updated on wait for TLSO brace. Pt verbalizes understanding.

## 2020-01-08 NOTE — Discharge Instructions (Signed)
Please schedule a follow-up appointment with mild and neurosurgery or with Dr. Marcell Barlow.

## 2024-10-09 ENCOUNTER — Encounter: Payer: Self-pay | Admitting: Emergency Medicine

## 2024-10-09 ENCOUNTER — Ambulatory Visit
Admission: EM | Admit: 2024-10-09 | Discharge: 2024-10-09 | Disposition: A | Discharge status: Home / Self Care | Attending: Emergency Medicine | Admitting: Emergency Medicine

## 2024-10-09 DIAGNOSIS — M79662 Pain in left lower leg: Secondary | ICD-10-CM

## 2024-10-09 DIAGNOSIS — S8012XA Contusion of left lower leg, initial encounter: Secondary | ICD-10-CM

## 2024-10-09 HISTORY — DX: Type 2 diabetes mellitus without complications: E11.9

## 2024-10-09 NOTE — ED Notes (Addendum)
 Patient is being discharged from the Urgent Care and sent to the [facility] Emergency Department via private vehicle . Per Dr. Van, patient is in need of higher level of care due to left lower leg pain and swelling. Patient is aware and verbalizes understanding of plan of care.  Vitals:   10/09/24 1308  BP: (!) 154/84  Pulse: 88  Resp: 15  Temp: 98.4 F (36.9 C)  SpO2: 98%
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
## 2024-10-09 NOTE — Discharge Instructions (Addendum)
 We do not have ultrasound here today to evaluate for DVT.  Differential includes calf hematoma, contusion.  I have low suspicion for a fracture.  I am sending you to the emergency department to rule out a DVT.
